# Patient Record
Sex: Female | Born: 1980 | Hispanic: Yes | Marital: Single | Smoking: Never smoker
Health system: Southern US, Community
[De-identification: ages and names within clinical notes are randomized; demographics above are authoritative.]

## PROBLEM LIST (undated history)

## (undated) DIAGNOSIS — K219 Gastro-esophageal reflux disease without esophagitis: Secondary | ICD-10-CM

## (undated) DIAGNOSIS — R51 Headache: Secondary | ICD-10-CM

## (undated) DIAGNOSIS — R519 Headache, unspecified: Secondary | ICD-10-CM

## (undated) HISTORY — PX: BREAST BIOPSY: SHX20

---

## 2010-02-14 ENCOUNTER — Inpatient Hospital Stay: Payer: Self-pay | Admitting: Psychiatry

## 2010-06-13 ENCOUNTER — Emergency Department: Payer: Self-pay | Admitting: Emergency Medicine

## 2010-07-03 ENCOUNTER — Emergency Department: Payer: Self-pay | Admitting: Emergency Medicine

## 2011-09-02 ENCOUNTER — Emergency Department: Payer: Self-pay | Admitting: Emergency Medicine

## 2016-05-08 ENCOUNTER — Ambulatory Visit: Payer: BLUE CROSS/BLUE SHIELD | Admitting: Anesthesiology

## 2016-05-08 ENCOUNTER — Encounter
Admission: RE | Disposition: A | Discharge status: Home / Self Care | Payer: Self-pay | Source: Ambulatory Visit | Attending: Otolaryngology

## 2016-05-08 ENCOUNTER — Ambulatory Visit
Admission: RE | Admit: 2016-05-08 | Discharge: 2016-05-08 | Disposition: A | Discharge status: Home / Self Care | Payer: BLUE CROSS/BLUE SHIELD | Source: Ambulatory Visit | Attending: Otolaryngology | Admitting: Otolaryngology

## 2016-05-08 DIAGNOSIS — K219 Gastro-esophageal reflux disease without esophagitis: Secondary | ICD-10-CM | POA: Diagnosis not present

## 2016-05-08 DIAGNOSIS — Z9889 Other specified postprocedural states: Secondary | ICD-10-CM | POA: Diagnosis not present

## 2016-05-08 DIAGNOSIS — J020 Streptococcal pharyngitis: Secondary | ICD-10-CM | POA: Insufficient documentation

## 2016-05-08 DIAGNOSIS — R51 Headache: Secondary | ICD-10-CM | POA: Insufficient documentation

## 2016-05-08 HISTORY — DX: Headache: R51

## 2016-05-08 HISTORY — DX: Gastro-esophageal reflux disease without esophagitis: K21.9

## 2016-05-08 HISTORY — DX: Headache, unspecified: R51.9

## 2016-05-08 HISTORY — PX: TONSILLECTOMY: SHX5217

## 2016-05-08 SURGERY — TONSILLECTOMY
Anesthesia: General | Site: Mouth | Wound class: Clean Contaminated

## 2016-05-08 MED ORDER — GLYCOPYRROLATE 0.2 MG/ML IJ SOLN
INTRAMUSCULAR | Status: DC | PRN
  Administered 2016-05-08: 0.2 mg via INTRAVENOUS

## 2016-05-08 MED ORDER — OXYCODONE HCL 5 MG PO TABS: 5.0000 mg | ORAL_TABLET | Freq: Once | ORAL | Status: AC | PRN

## 2016-05-08 MED ORDER — SUCCINYLCHOLINE CHLORIDE 20 MG/ML IJ SOLN
INTRAMUSCULAR | Status: DC | PRN
  Administered 2016-05-08: 100 mg via INTRAVENOUS

## 2016-05-08 MED ORDER — MIDAZOLAM HCL 5 MG/5ML IJ SOLN
INTRAMUSCULAR | Status: DC | PRN
Start: 2016-05-08 — End: 2016-05-08
  Administered 2016-05-08: 2 mg via INTRAVENOUS

## 2016-05-08 MED ORDER — LACTATED RINGERS IV SOLN
INTRAVENOUS | Status: DC
  Administered 2016-05-08: 08:00:00 via INTRAVENOUS

## 2016-05-08 MED ORDER — ACETAMINOPHEN 10 MG/ML IV SOLN
1000.0000 mg | Freq: Once | INTRAVENOUS | Status: AC
  Administered 2016-05-08: 1000 mg via INTRAVENOUS

## 2016-05-08 MED ORDER — PROMETHAZINE HCL 12.5 MG PO TABS: 12.5000 mg | ORAL_TABLET | Freq: Four times a day (QID) | ORAL | 0 refills | Status: DC | PRN

## 2016-05-08 MED ORDER — MEPERIDINE HCL 25 MG/ML IJ SOLN: 6.2500 mg | INTRAMUSCULAR | Status: DC | PRN

## 2016-05-08 MED ORDER — HYDROMORPHONE HCL 1 MG/ML IJ SOLN
0.2500 mg | INTRAMUSCULAR | Status: DC | PRN
  Administered 2016-05-08 (×2): 0.2 mg via INTRAVENOUS
  Administered 2016-05-08: 0.3 mg via INTRAVENOUS
  Administered 2016-05-08: 0.2 mg via INTRAVENOUS

## 2016-05-08 MED ORDER — OXYCODONE HCL 5 MG/5ML PO SOLN
5.0000 mg | Freq: Once | ORAL | Status: AC | PRN
  Administered 2016-05-08: 5 mg via ORAL

## 2016-05-08 MED ORDER — LIDOCAINE VISCOUS 2 % MT SOLN: 10.0000 mL | Freq: Four times a day (QID) | OROMUCOSAL | 0 refills | Status: DC | PRN

## 2016-05-08 MED ORDER — PROMETHAZINE HCL 25 MG/ML IJ SOLN: 6.2500 mg | INTRAMUSCULAR | Status: DC | PRN

## 2016-05-08 MED ORDER — LIDOCAINE HCL (CARDIAC) 20 MG/ML IV SOLN
INTRAVENOUS | Status: DC | PRN
  Administered 2016-05-08: 50 mg via INTRAVENOUS

## 2016-05-08 MED ORDER — DEXAMETHASONE SODIUM PHOSPHATE 4 MG/ML IJ SOLN
INTRAMUSCULAR | Status: DC | PRN
  Administered 2016-05-08: 8 mg via INTRAVENOUS

## 2016-05-08 MED ORDER — OXYMETAZOLINE HCL 0.05 % NA SOLN
NASAL | Status: DC | PRN
  Administered 2016-05-08: 1 via TOPICAL

## 2016-05-08 MED ORDER — PROPOFOL 10 MG/ML IV BOLUS
INTRAVENOUS | Status: DC | PRN
Start: 2016-05-08 — End: 2016-05-08
  Administered 2016-05-08: 200 mg via INTRAVENOUS

## 2016-05-08 MED ORDER — FENTANYL CITRATE (PF) 100 MCG/2ML IJ SOLN
INTRAMUSCULAR | Status: DC | PRN
  Administered 2016-05-08: 50 ug via INTRAVENOUS
  Administered 2016-05-08: 100 ug via INTRAVENOUS

## 2016-05-08 MED ORDER — SCOPOLAMINE 1 MG/3DAYS TD PT72
1.0000 | MEDICATED_PATCH | TRANSDERMAL | Status: DC
  Administered 2016-05-08: 1.5 mg via TRANSDERMAL

## 2016-05-08 MED ORDER — ONDANSETRON HCL 4 MG/2ML IJ SOLN
INTRAMUSCULAR | Status: DC | PRN
  Administered 2016-05-08: 4 mg via INTRAVENOUS

## 2016-05-08 MED ORDER — BUPIVACAINE HCL (PF) 0.25 % IJ SOLN
INTRAMUSCULAR | Status: DC | PRN
  Administered 2016-05-08: 2 mL

## 2016-05-08 MED ORDER — HYDROCODONE-ACETAMINOPHEN 7.5-325 MG/15ML PO SOLN: 10.0000 mL | Freq: Four times a day (QID) | ORAL | 0 refills | Status: DC | PRN

## 2016-05-08 SURGICAL SUPPLY — 17 items
BLADE BOVIE TIP EXT 4 (BLADE) ×3 IMPLANT
CANISTER SUCT 1200ML W/VALVE (MISCELLANEOUS) ×3 IMPLANT
CATH ROBINSON RED A/P 10FR (CATHETERS) ×3 IMPLANT
COAG SUCT 10F 3.5MM HAND CTRL (MISCELLANEOUS) ×3 IMPLANT
GLOVE BIO SURGEON STRL SZ7.5 (GLOVE) ×6 IMPLANT
HANDLE SUCTION POOLE (INSTRUMENTS) ×1 IMPLANT
KIT ROOM TURNOVER OR (KITS) ×3 IMPLANT
NEEDLE HYPO 25GX1X1/2 BEV (NEEDLE) ×3 IMPLANT
NS IRRIG 500ML POUR BTL (IV SOLUTION) ×3 IMPLANT
PACK TONSIL/ADENOIDS (PACKS) ×3 IMPLANT
PAD GROUND ADULT SPLIT (MISCELLANEOUS) ×3 IMPLANT
PENCIL ELECTRO HAND CTR (MISCELLANEOUS) ×3 IMPLANT
SOL ANTI-FOG 6CC FOG-OUT (MISCELLANEOUS) ×1 IMPLANT
SOL FOG-OUT ANTI-FOG 6CC (MISCELLANEOUS) ×2
STRAP BODY AND KNEE 60X3 (MISCELLANEOUS) ×3 IMPLANT
SUCTION POOLE HANDLE (INSTRUMENTS) ×3
SYR 5ML LL (SYRINGE) ×3 IMPLANT

## 2016-05-08 NOTE — H&P (Signed)
..  History and Physical paper copy reviewed and updated date of procedure and will be scanned into system.  

## 2016-05-08 NOTE — Discharge Instructions (Signed)
Amigdalectoma, adultos, cuidados posteriores (Tonsillectomy, Adult, Care After) Siga estas instrucciones durante las prximas semanas. Estas indicaciones le proporcionan informacin acerca de cmo deber cuidarse despus del procedimiento. El mdico tambin podr darle instrucciones ms especficas. El tratamiento se ha planificado de acuerdo a las prcticas mdicas actuales, pero a veces se producen problemas. Comunquese con el mdico si tiene algn problema o tiene dudas despus del procedimiento. QU ESPERAR DESPUS DEL PROCEDIMIENTO Despus del procedimiento, es tpico tener los siguientes sntomas:  Sentir la [place] dormida y se reducir su sentido del gusto.  Puede sentir dolor y dificultades para tragar.  Puede sentir dolor en la [employer] o sentir un chasquido al Barista.  Al beber lquidos, estos podran gotear por la [employer].  Puede que su voz suene apagada.  La zona central superior de la boca, (campanilla) podra estar muy hinchada.  Es posible que tenga tos constante y Glass blower/designer la mucosidad y la flema de su garganta. INSTRUCCIONES PARA EL CUIDADO EN EL HOGAR   Descanse todo lo que pueda, manteniendo siempre la cabeza elevada.  Beba abundantes lquidos. Esto Engineer, materials y favorece el proceso de curacin.  Tome los medicamentos solamente como se lo haya indicado el mdico.  Los alimentos blandos y fros tales como la gelatina, los sorbetes, los [place], los helados de agua y las bebidas fras generalmente son los que mejor se Research scientist (physical sciences). Algunos das despus de la ciruga, podr comer ms alimentos slidos.  Evite los enjuagues bucales y las grgaras.  Evite el contacto con personas que padezcan infecciones respiratorias superiores como resfros o anginas. SOLICITE ATENCIN MDICA SI:   Teacher, music y no puede controlarlo con los medicamentos.  Tiene fiebre.  Tiene una erupcin cutnea.  Se siente mareado o se desmaya.  No puede  tragar, incluso pequeas cantidades de lquidos o saliva.  Su orina se vuelve [employer]. SOLICITE ATENCIN MDICA DE INMEDIATO SI:   Tiene dificultad para respirar.  Tiene efectos secundarios o una reaccin alrgica a los medicamentos.  Tiene un sangrado de color rojo brillante por la garganta o vomita sangre.   Esta informacin no tiene Theme park manager el consejo del mdico. Asegrese de hacerle al mdico cualquier pregunta que tenga.   Document Released: 02/02/2007 Document Revised: 12/06/2014 Elsevier Interactive Patient Education 2016 [employer].    [place] general, adultos, cuidados posteriores (General Anesthesia, Adult, Care After) Siga estas instrucciones durante las prximas semanas. Estas indicaciones le proporcionan informacin acerca de cmo deber cuidarse despus del procedimiento. El mdico tambin podr darle instrucciones ms especficas. El tratamiento se ha planificado de acuerdo a las prcticas mdicas actuales, pero a veces se producen problemas. Comunquese con el mdico si tiene algn problema o tiene dudas despus del procedimiento. QU ESPERAR DESPUS DEL PROCEDIMIENTO Despus del procedimiento es habitual experimentar:  Somnolencia.  Nuseas y vmitos. INSTRUCCIONES PARA EL CUIDADO EN EL HOGAR  Durante las primeras 24 horas luego de la anestesia general:  Haga que una persona responsable se quede con usted.  No conduzca un automvil. Si est solo, no viaje en transporte pblico.  No beba alcohol.  No tome medicamentos que no le haya recetado su mdico.  No firme documentos importantes ni tome decisiones trascendentes.  Puede reanudar su dieta y sus actividades normales segn le haya indicado el mdico.  Cambie los vendajes (apsitos) tal como se le indic.  Si tiene preguntas o se le presenta algn problema relacionado con la anestesia general, comunquese con el hospital y pida por el  anestesista o anestesilogo de [place]. SOLICITE  ATENCIN MDICA SI:  Tiene nuseas y vmitos durante el da posterior a la anestesia.  Le aparece una erupcin cutnea. SOLICITE ATENCIN MDICA DE INMEDIATO SI:   Tiene dificultad para respirar.  Siente dolor en el pecho.  Tiene algn problema alrgico.   Esta informacin no tiene como fin reemplazar el consejo del mdico. Asegrese de hacerle al mdico cualquier pregunta que tenga.   Document Released: 07/22/2005 Document Revised: 08/12/2014 Elsevier Interactive Patient Education [employer]2016 Elsevier Inc.

## 2016-05-08 NOTE — Transfer of Care (Signed)
Immediate Anesthesia Transfer of Care Note  Patient: Michelle Mcbride  Procedure(s) Performed: Procedure(s) with comments: TONSILLECTOMY (N/A) - NEEDS INTERPRETER  Patient Location: PACU  Anesthesia Type: General  Level of Consciousness: awake, alert  and patient cooperative  Airway and Oxygen Therapy: Patient Spontanous Breathing and Patient connected to supplemental oxygen  Post-op Assessment: Post-op Vital signs reviewed, Patient's Cardiovascular Status Stable, Respiratory Function Stable, Patent Airway and No signs of Nausea or vomiting  Post-op Vital Signs: Reviewed and stable  Complications: No apparent anesthesia complications

## 2016-05-08 NOTE — Anesthesia Postprocedure Evaluation (Signed)
Anesthesia Post Note  Patient: Michelle Mcbride  ProValinda Partycedure(s) Performed: Procedure(s) (LRB): TONSILLECTOMY (N/A)  Patient location during evaluation: PACU Anesthesia Type: General Level of consciousness: awake and alert and oriented Pain management: pain level controlled Vital Signs Assessment: post-procedure vital signs reviewed and stable Respiratory status: spontaneous breathing and nonlabored ventilation Cardiovascular status: stable Postop Assessment: no signs of nausea or vomiting and adequate PO intake Anesthetic complications: no    Michelle Mcbride

## 2016-05-08 NOTE — Anesthesia Procedure Notes (Signed)
Procedure Name: Intubation Date/Time: 05/08/2016 8:27 AM Performed by: Andee PolesBUSH, Ninamarie Keel Pre-anesthesia Checklist: Patient identified, Emergency Drugs available, Suction available, Patient being monitored and Timeout performed Patient Re-evaluated:Patient Re-evaluated prior to inductionOxygen Delivery Method: Circle system utilized Preoxygenation: Pre-oxygenation with 100% oxygen Intubation Type: IV induction Ventilation: Mask ventilation without difficulty Laryngoscope Size: Mac and 3 Grade View: Grade II Tube type: Oral Rae Tube size: 7.0 mm Number of attempts: 2 Airway Equipment and Method: Bougie stylet Placement Confirmation: ETT inserted through vocal cords under direct vision,  positive ETCO2 and breath sounds checked- equal and bilateral Tube secured with: Tape Dental Injury: Teeth and Oropharynx as per pre-operative assessment

## 2016-05-08 NOTE — Anesthesia Preprocedure Evaluation (Signed)
Anesthesia Evaluation  Patient identified by MRN, date of birth, ID band Patient awake    Reviewed: Allergy & Precautions, NPO status , Patient's Chart, lab work & pertinent test results  Airway Mallampati: I  TM Distance: >3 FB Neck ROM: Full    Dental no notable dental hx.    Pulmonary neg pulmonary ROS,    Pulmonary exam normal        Cardiovascular negative cardio ROS Normal cardiovascular exam     Neuro/Psych  Headaches,    GI/Hepatic Neg liver ROS, GERD  Controlled,  Endo/Other  negative endocrine ROS  Renal/GU negative Renal ROS     Musculoskeletal negative musculoskeletal ROS (+)   Abdominal   Peds  Hematology negative hematology ROS (+)   Anesthesia Other Findings   Reproductive/Obstetrics negative OB ROS                             Anesthesia Physical Anesthesia Plan  ASA: II  Anesthesia Plan: General   Post-op Pain Management:    Induction: Intravenous  Airway Management Planned: Oral ETT  Additional Equipment:   Intra-op Plan:   Post-operative Plan:   Informed Consent: I have reviewed the patients History and Physical, chart, labs and discussed the procedure including the risks, benefits and alternatives for the proposed anesthesia with the patient or authorized representative who has indicated his/her understanding and acceptance.     Plan Discussed with: CRNA  Anesthesia Plan Comments:         Anesthesia Quick Evaluation

## 2016-05-08 NOTE — Op Note (Signed)
..  05/08/2016  8:48 AM    Valinda PartyMiranda, Michelle  [id number]   Pre-Op Dx:  RECURRENT STREP  Post-op Dx: RECURRENT STREP  Proc:Tonsillectomy > age 35  Surg: Obie Silos  Anes:  General Endotracheal  EBL:  <5  Comp:  None  Findings:  3+ friable and erythematous cryptic tonsils bilaterally L>R, fibrotic scar  Procedure: After the patient was identified in holding and the history and physical and consent was reviewed, the patient was taken to the operating room and placed in a supine position.  General endotracheal anesthesia was induced in the normal fashion.  At this time, the patient was rotated 45 degrees and a shoulder roll was placed.  At this time, a McIvor mouthgag was inserted into the patient's oral cavity and suspended from the Mayo stand without injury to teeth, lips, or gums.  Next a red rubber catheter was inserted into the patient left nostril for retraction of the uvula and soft palate superiorly.  Next a curved Alice clamp was attached to the patient's right superior tonsillar pole and retracted medially and inferiorly.  A Bovie electrocautery was used to dissect the patient's right tonsil in a subcapsular plane.  Meticulous hemostasis was achieved with Bovie suction cautery.  At this time, the mouth gag was released from suspension for 1 minute.  Attention now was directed to the patient's left side.  In a similar fashion the curved Alice clamp was attached to the superior pole and this was retracted medially and inferiorly and the tonsil was excised in a subcapsular plane with Bovie electrocautery.  After completion of the second tonsil, meticulous hemostasis was continued.  At this time, attention was directed to the patient's Adenoidectomy.  Under indirect visualization using an operating mirror, the adenoid tissue was visualized noted to be absent in nature.  At this time, the patient's nasal cavity and oral cavity was irrigated with sterile saline.  Two cc's of 0.5%  Marcaine was injected into the anterior and posterior tonsillar fossa bilaterally.  Following this  The care of patient was returned to anesthesia, awakened, and transferred to recovery in stable condition.  Dispo:  PACU to home  Plan: Soft diet.  Limit exercise and strenuous activity for 2 weeks.  Fluid hydration  Recheck my office three weeks.   Vivi Piccirilli 8:48 AM 05/08/2016

## 2016-05-09 ENCOUNTER — Encounter: Payer: Self-pay | Admitting: Otolaryngology

## 2016-05-10 LAB — SURGICAL PATHOLOGY

## 2016-05-24 ENCOUNTER — Emergency Department: Payer: BLUE CROSS/BLUE SHIELD

## 2016-05-24 ENCOUNTER — Emergency Department
Admission: EM | Admit: 2016-05-24 | Discharge: 2016-05-24 | Disposition: A | Discharge status: Home / Self Care | Payer: BLUE CROSS/BLUE SHIELD | Attending: Emergency Medicine | Admitting: Emergency Medicine

## 2016-05-24 ENCOUNTER — Encounter: Payer: Self-pay | Admitting: Emergency Medicine

## 2016-05-24 DIAGNOSIS — M545 Low back pain, unspecified: Secondary | ICD-10-CM

## 2016-05-24 DIAGNOSIS — S22060A Wedge compression fracture of T7-T8 vertebra, initial encounter for closed fracture: Secondary | ICD-10-CM | POA: Diagnosis not present

## 2016-05-24 DIAGNOSIS — Y9389 Activity, other specified: Secondary | ICD-10-CM | POA: Diagnosis not present

## 2016-05-24 DIAGNOSIS — Y929 Unspecified place or not applicable: Secondary | ICD-10-CM | POA: Diagnosis not present

## 2016-05-24 DIAGNOSIS — W109XXA Fall (on) (from) unspecified stairs and steps, initial encounter: Secondary | ICD-10-CM | POA: Insufficient documentation

## 2016-05-24 DIAGNOSIS — Y999 Unspecified external cause status: Secondary | ICD-10-CM | POA: Diagnosis not present

## 2016-05-24 DIAGNOSIS — K5641 Fecal impaction: Secondary | ICD-10-CM | POA: Insufficient documentation

## 2016-05-24 DIAGNOSIS — W19XXXA Unspecified fall, initial encounter: Secondary | ICD-10-CM

## 2016-05-24 DIAGNOSIS — S22050A Wedge compression fracture of T5-T6 vertebra, initial encounter for closed fracture: Secondary | ICD-10-CM | POA: Diagnosis not present

## 2016-05-24 DIAGNOSIS — S299XXA Unspecified injury of thorax, initial encounter: Secondary | ICD-10-CM | POA: Diagnosis present

## 2016-05-24 DIAGNOSIS — Z79899 Other long term (current) drug therapy: Secondary | ICD-10-CM | POA: Diagnosis not present

## 2016-05-24 DIAGNOSIS — S22000A Wedge compression fracture of unspecified thoracic vertebra, initial encounter for closed fracture: Secondary | ICD-10-CM

## 2016-05-24 LAB — HEMOGLOBIN AND HEMATOCRIT, BLOOD
HCT: 41.8 % (ref 35.0–47.0)
HEMOGLOBIN: 14.6 g/dL (ref 12.0–16.0)

## 2016-05-24 LAB — HCG, QUANTITATIVE, PREGNANCY: HCG, BETA CHAIN, QUANT, S: 1 m[IU]/mL (ref <5)

## 2016-05-24 MED ORDER — MORPHINE SULFATE (PF) 2 MG/ML IV SOLN
4.0000 mg | Freq: Once | INTRAVENOUS | Status: AC
  Administered 2016-05-24: 4 mg via INTRAVENOUS
  Filled 2016-05-24: qty 2

## 2016-05-24 MED ORDER — MORPHINE SULFATE (PF) 2 MG/ML IV SOLN
2.0000 mg | Freq: Once | INTRAVENOUS | Status: AC
Start: 2016-05-24 — End: 2016-05-24
  Administered 2016-05-24: 2 mg via INTRAVENOUS
  Filled 2016-05-24: qty 1

## 2016-05-24 MED ORDER — ONDANSETRON HCL 4 MG/2ML IJ SOLN
4.0000 mg | Freq: Once | INTRAMUSCULAR | Status: AC
  Administered 2016-05-24: 4 mg via INTRAVENOUS
  Filled 2016-05-24: qty 2

## 2016-05-24 MED ORDER — OXYCODONE HCL 5 MG PO TABS: 5.0000 mg | ORAL_TABLET | ORAL | 0 refills | Status: AC | PRN

## 2016-05-24 MED ORDER — DIAZEPAM 5 MG PO TABS
5.0000 mg | ORAL_TABLET | Freq: Once | ORAL | Status: AC
  Administered 2016-05-24: 5 mg via ORAL
  Filled 2016-05-24: qty 1

## 2016-05-24 MED ORDER — ACETAMINOPHEN 500 MG PO TABS
1000.0000 mg | ORAL_TABLET | Freq: Once | ORAL | Status: AC
  Administered 2016-05-24: 1000 mg via ORAL
  Filled 2016-05-24: qty 2

## 2016-05-24 MED ORDER — CYCLOBENZAPRINE HCL 10 MG PO TABS
10.0000 mg | ORAL_TABLET | Freq: Three times a day (TID) | ORAL | 0 refills | Status: AC | PRN
Start: 2016-05-24 — End: ?

## 2016-05-24 MED ORDER — HYDROMORPHONE HCL 1 MG/ML IJ SOLN
1.0000 mg | INTRAMUSCULAR | Status: DC | PRN
  Administered 2016-05-24: 1 mg via INTRAVENOUS
  Filled 2016-05-24: qty 1

## 2016-05-24 MED ORDER — NAPROXEN 500 MG PO TABS: 500.0000 mg | ORAL_TABLET | Freq: Two times a day (BID) | ORAL | Status: DC

## 2016-05-24 MED ORDER — HYDROCODONE-ACETAMINOPHEN 5-325 MG PO TABS: 1.0000 | ORAL_TABLET | Freq: Four times a day (QID) | ORAL | 0 refills | Status: AC | PRN

## 2016-05-24 MED ORDER — KETAMINE HCL 10 MG/ML IJ SOLN: 0.2000 mg/kg | Freq: Once | INTRAMUSCULAR | Status: DC

## 2016-05-24 MED ORDER — OXYCODONE HCL 5 MG PO TABS
5.0000 mg | ORAL_TABLET | ORAL | Status: DC | PRN
  Administered 2016-05-24: 5 mg via ORAL
  Filled 2016-05-24: qty 1

## 2016-05-24 MED ORDER — NAPROXEN 500 MG PO TABS: 500.0000 mg | ORAL_TABLET | Freq: Two times a day (BID) | ORAL | 0 refills | Status: AC

## 2016-05-24 NOTE — Discharge Instructions (Addendum)
° °  Take hydrocodone as prescribed for severe pain. Do not drink alcohol, drive or participate in any other potentially dangerous activities while taking this medication as it may make you sleepy. Do not take this medication with any other sedating medications, either prescription or over-the-counter. If you were prescribed Percocet or Vicodin, do not take these with acetaminophen (Tylenol) as it is already contained within these medications.   This medication is an opiate (or narcotic) pain medication and can be habit forming.  Use it as little as possible to achieve adequate pain control.  Do not use or use it with extreme caution if you have a history of opiate abuse or dependence.  If you are on a pain contract with your primary care doctor or a pain specialist, be sure to let them know you were prescribed this medication today from the [facility].  This medication is intended for your use only - do not give any to anyone else and keep it in a secure place where nobody else, especially children, have access to it.  It will also cause or worsen constipation, so you may want to consider taking an over-the-counter stool softener while you are taking this medication.  Please follow up with your doctor as soon as possible regarding today's ED visit and your back pain.  Return to the ED for worsening back pain, fever, weakness or numbness of either leg, or if you develop either (1) an inability to urinate or have bowel movements, or (2) loss of your ability to control your bathroom functions (if you start having "accidents"), or if you develop other new symptoms that concern you.

## 2016-05-24 NOTE — ED Notes (Addendum)
Pt received pain medication with EMS, unable to recall what they gave her.

## 2016-05-24 NOTE — ED Triage Notes (Signed)
Pt fell down 16 stairs. Denies LOC. C/o severe back pain. Pt appears in severe pain. c collar applied in triage

## 2016-05-24 NOTE — ED Provider Notes (Signed)
Patient was received in signout from Dr. Fanny BienQuale.  Patient fell down flight of stairs complaining of a diffuse midline back pain. CT scan of the neck shows no evidence of cervical spine fracture. Patient had no loss of consciousness. Lumbar and thoracic spine films are concerning for compression fracture. Given mechanism of injury and persistent pain will order CT imaging to further evaluate.  ----------------------------------------- 8:31 PM on 05/24/2016 -----------------------------------------  CT imaging does show evidence of stable mild superior endplate compression fractures of the thoracic spine. On repeat clinical exam the patient does have evidence of probable fracture of the coccyx. Pain was controlled with IV analgesia and then transition to oral pain medication. Patient was able to ambulate with a steady gait. She has a normal neuro exam. Patient stable for outpatient follow-up.   Willy EddyPatrick Riaan Toledo, MD 05/24/16 2042

## 2016-05-24 NOTE — ED Notes (Signed)
MD at bedside with interpreter

## 2016-05-24 NOTE — ED Notes (Signed)
With interpreter at bedside to assess patient.

## 2016-05-24 NOTE — ED Provider Notes (Signed)
[facility] Emergency Department Provider Note   ____________________________________________   First MD Initiated Contact with Patient 05/24/16 1315     (approximate)  I have reviewed the triage vital signs and the nursing notes.   HISTORY  Chief Complaint Fall  Spanish interpreter utilize  HPI Michelle Mcbride is a 35 y.o. female presents for evaluation of falling down stairs  Patient reports that she was reaching to keep if child away from the stairs when she fell onto her buttock, and then rolled down approximately 16 stairs. No loss of consciousness. She did not strike her head.  Patient reports having pain in her neck and lower back. She reports any attempt to move her legs causes a lot of pain in her lower back. Denies injury to the arms or legs. Does not take any medications. Had a tonsillectomy at the beginning of this month, otherwise denies significant surgical history. Really not taking any blood thinners  Denies headache. Denies numbness or tingling. No alcohol or drug use  No chest pain or abdominal pain. No bleeding  Past Medical History:  Diagnosis Date  . GERD (gastroesophageal reflux disease)   . Headache     There are no active problems to display for this patient.   Past Surgical History:  Procedure Laterality Date  . BREAST BIOPSY Left    states it was just a cyst removed  . TONSILLECTOMY N/A 05/08/2016   Procedure: TONSILLECTOMY;  Surgeon: Bud Face, MD;  Location: [facility];  Service: ENT;  Laterality: N/A;  NEEDS INTERPRETER    Prior to Admission medications   Medication Sig Start Date End Date Taking? Authorizing Provider  oxyCODONE (ROXICODONE) 5 MG/5ML solution Take 5-10 mLs by mouth every 6 (six) hours as needed. 05/20/16  Yes Historical Provider, MD  promethazine (PHENERGAN) 12.5 MG tablet Take 1 tablet by mouth every 8 (eight) hours as needed. 05/08/16  Yes Historical Provider, MD  RA LAXATIVE 5 MG  EC tablet Take 1-3 tablets by mouth daily as needed. 05/20/16  Yes Historical Provider, MD  traZODone (DESYREL) 50 MG tablet Take 1 tablet by mouth at bedtime as needed. 05/20/16  Yes Historical Provider, MD    Allergies Review of patient's allergies indicates no known allergies.  History reviewed. No pertinent family history.  Social History Social History  Substance Use Topics  . Smoking status: Never Smoker  . Smokeless tobacco: Never Used  . Alcohol use No    Review of Systems Constitutional: No fever/chills Eyes: No visual changes. ENT: No sore throat. Cardiovascular: Denies chest pain. Respiratory: Denies shortness of breath. Gastrointestinal: No abdominal pain.  No nausea, no vomiting.  No diarrhea.  No constipation. Genitourinary: Negative for dysuria. Musculoskeletal: See history of present illness Skin: Negative for rash. Neurological: Negative for headaches, focal weakness or numbness.  10-point ROS otherwise negative.  ____________________________________________   PHYSICAL EXAM:  VITAL SIGNS: ED Triage Vitals [05/24/16 1242]  Enc Vitals Group     BP 110/79     Pulse Rate 77     Resp (!) 24     Temp 97.6 F (36.4 C)     Temp Source Oral     SpO2 100 %     Weight 140 lb (63.5 kg)     Height      Head Circumference      Peak Flow      Pain Score 9     Pain Loc      Pain Edu?  Excl. in GC?     Constitutional: Alert and oriented. Well appearing and in no acute distressBut does appear uncomfortable laying supine in bed. Eyes: Conjunctivae are normal. PERRL. EOMI. Head: Atraumatic. Nose: No congestion/rhinnorhea. Mouth/Throat: Mucous membranes are moist.  Oropharynx non-erythematous. Neck: No stridor.  Cervical collar in place.  Log-rolled and Patient reports tenderness throughout the midline spine, though no focality is obvious. No step-off or deformity Cardiovascular: Normal rate, regular rhythm. Grossly normal heart sounds.  Good peripheral  circulation. Respiratory: Normal respiratory effort.  No retractions. Lungs CTAB. Gastrointestinal: Soft and nontender. No distention. No abdominal bruits. No CVA tenderness. Musculoskeletal: No lower extremity tenderness nor edema.  Patient reports any attempt to move the lower legs causes pain in her lower back. No neurologic deficit, able to wiggle all toes, able to flex at the hips and knees but does refer pain to the lower back. Vascular pulses normal lower extremity Neurologic:  Normal speech and language. No gross focal neurologic deficits are appreciated. Skin:  Skin is warm, dry and intact. No rash noted. Psychiatric: Mood and affect are normal. Speech and behavior are normal.  ____________________________________________   LABS (all labs ordered are listed, but only abnormal results are displayed)  Labs Reviewed  HCG, QUANTITATIVE, PREGNANCY  HEMOGLOBIN AND HEMATOCRIT, BLOOD   ____________________________________________  EKG   ____________________________________________  RADIOLOGY  Ct Cervical Spine Wo Contrast  Result Date: 05/24/2016 CLINICAL DATA:  35 year old female with neck and cervical spine pain following fall. Initial encounter. EXAM: CT CERVICAL SPINE WITHOUT CONTRAST TECHNIQUE: Multidetector CT imaging of the cervical spine was performed without intravenous contrast. Multiplanar CT image reconstructions were also generated. COMPARISON:  None. FINDINGS: Alignment: Normal. Skull base and vertebrae: No acute fracture. No primary bone lesion or focal pathologic process. Soft tissues and spinal canal: No prevertebral fluid or swelling. No visible canal hematoma. Tiny foci of gas along the anterior aspects of C1 and C2 are likely venous from IV. Disc levels:  Unremarkable. Upper chest: Negative. Other: None IMPRESSION: No evidence of acute injury or significant abnormalities. Electronically Signed   By: Harmon PierJeffrey  Hu M.D.   On: 05/24/2016 15:40     ____________________________________________   PROCEDURES  Procedure(s) performed: None  Procedures  Critical Care performed: No  ____________________________________________   INITIAL IMPRESSION / ASSESSMENT AND PLAN / ED COURSE  Pertinent labs & imaging results that were available during my care of the patient were reviewed by me and considered in my medical decision making (see chart for details).  Traumatic injury. Patient lost balance fell forward landing on her coccyx, then describes rolling down approximately 16 steps. She reports she was able to protect her head and did not strike her head and denies any headache, denies any direct fall or trauma to the head. She is neurologically intact but did note significant pain especially in the lower back. We'll proceed with plain film imaging of the thoracic and lumbar spine, plus CT of the cervical spine. Pain control, continue to monitor closely.  Clinical Course   ----------------------------------------- 3:47 PM on 05/24/2016 -----------------------------------------  Patient reports modest improvement in pain. She is resting comfortably. She reported to the interpreter that she has been having depression recently, denies any thoughts of harming herself or others. [facility], and reports over the last few months her depression symptoms seem to be coming back mostly low energy and feeling of no value. Discussed with the patient, she adamantly denies any thoughts of harming herself or anyone  else, and she is agreeable to calling her psychiatrist on Monday at [facility]  Ongoing care assigned to Dr. Roxan Hockey. Plan follow up on x-rays and reevaluate the patient for patient control and injury thereafter. Probable discharge after reexam and no evidence of injury on radiographs.  ____________________________________________   FINAL CLINICAL IMPRESSION(S) / ED DIAGNOSES  Final diagnoses:  Fall, initial  encounter  Acute midline low back pain without sciatica      NEW MEDICATIONS STARTED DURING THIS VISIT:  New Prescriptions   No medications on file     Note:  This document was prepared using Dragon voice recognition software and may include unintentional dictation errors.     Sharyn Creamer, MD 05/24/16 [phone]

## 2016-05-24 NOTE — ED Notes (Signed)
Pain between shoulder blade and lower down towards coccyx.  Pt in c-ollar at time of assessment. Pt tearful. Denies hitting head.

## 2017-06-25 ENCOUNTER — Encounter: Payer: Self-pay | Admitting: Emergency Medicine

## 2017-06-25 ENCOUNTER — Emergency Department
Admission: EM | Admit: 2017-06-25 | Discharge: 2017-06-25 | Disposition: A | Discharge status: Home / Self Care | Payer: BLUE CROSS/BLUE SHIELD | Attending: Emergency Medicine | Admitting: Emergency Medicine

## 2017-06-25 DIAGNOSIS — R509 Fever, unspecified: Secondary | ICD-10-CM | POA: Diagnosis present

## 2017-06-25 DIAGNOSIS — H65192 Other acute nonsuppurative otitis media, left ear: Secondary | ICD-10-CM | POA: Insufficient documentation

## 2017-06-25 DIAGNOSIS — B349 Viral infection, unspecified: Secondary | ICD-10-CM | POA: Insufficient documentation

## 2017-06-25 DIAGNOSIS — H65112 Acute and subacute allergic otitis media (mucoid) (sanguinous) (serous), left ear: Secondary | ICD-10-CM

## 2017-06-25 MED ORDER — AMOXICILLIN 500 MG PO CAPS
1000.0000 mg | ORAL_CAPSULE | Freq: Once | ORAL | Status: AC
  Administered 2017-06-25: 1000 mg via ORAL
  Filled 2017-06-25: qty 2

## 2017-06-25 MED ORDER — PSEUDOEPH-BROMPHEN-DM 30-2-10 MG/5ML PO SYRP: 10.0000 mL | ORAL_SOLUTION | Freq: Four times a day (QID) | ORAL | 0 refills | Status: AC | PRN

## 2017-06-25 MED ORDER — AMOXICILLIN 875 MG PO TABS: 875.0000 mg | ORAL_TABLET | Freq: Two times a day (BID) | ORAL | 0 refills | Status: AC

## 2017-06-25 NOTE — ED Notes (Signed)
Patient with complaint of left ear pain that started on Sunday.

## 2017-06-25 NOTE — ED Provider Notes (Signed)
[facility] Emergency Department Provider Note  ____________________________________________  Time seen: Approximately 7:48 PM  I have reviewed the triage vital signs and the nursing notes.   HISTORY  Chief Complaint Otalgia    HPI Michelle Mcbride is a 36 y.o. female who presents emergency department complaining of fevers and chills, body aches, nasal congestion, sore throat, ear pain.  Patient reports that symptoms began with fevers and chills and body aches.  Patient then began to experience nasal congestion, sore throat, coughing.  Over the last 2 days, patient has developed sharp left ear pain.  This is her primary complaint at this time.  Patient has been taking Tylenol Motrin which improves the body aches and fevers and chills.  Patient reports that nasal congestion is minimal.  Sore throat improves with Tylenol and Motrin.  Patient denies any hearing loss in the left ear.  No history of repeated infections.  Other than Tylenol Motrin, no medications at home.  No other complaints at this time.  Patient denies any headache, visual changes, neck pain, chest pain, shortness of breath, abdominal pain, nausea vomiting, diarrhea or constipation.  Past Medical History:  Diagnosis Date  . GERD (gastroesophageal reflux disease)   . Headache     There are no active problems to display for this patient.   Past Surgical History:  Procedure Laterality Date  . BREAST BIOPSY Left    states it was just a cyst removed  . TONSILLECTOMY N/A 05/08/2016   Procedure: TONSILLECTOMY;  Surgeon: Bud Facereighton Vaught, MD;  [facility];  Service: ENT;  Laterality: N/A;  NEEDS INTERPRETER    Prior to Admission medications   Medication Sig Start Date End Date Taking? Authorizing Provider  amoxicillin (AMOXIL) 875 MG tablet Take 1 tablet (875 mg total) by mouth 2 (two) times daily. 06/25/17   Ninoshka Wainwright, Delorise RoyalsJonathan D, PA-C  brompheniramine-pseudoephedrine-DM 30-2-10 MG/5ML  syrup Take 10 mLs by mouth 4 (four) times daily as needed. 06/25/17   Khy Pitre, Delorise RoyalsJonathan D, PA-C  cyclobenzaprine (FLEXERIL) 10 MG tablet Take 1 tablet (10 mg total) by mouth 3 (three) times daily as needed for muscle spasms. 05/24/16   Willy Eddyobinson, Patrick, MD  HYDROcodone-acetaminophen (NORCO/VICODIN) 5-325 MG tablet Take 1 tablet by mouth every 6 (six) hours as needed for moderate pain. 05/24/16   Sharyn CreamerQuale, Mark, MD  oxyCODONE (ROXICODONE) 5 MG/5ML solution Take 5-10 mLs by mouth every 6 (six) hours as needed. 05/20/16   [provider]  promethazine (PHENERGAN) 12.5 MG tablet Take 1 tablet by mouth every 8 (eight) hours as needed. 05/08/16   [provider]  RA LAXATIVE 5 MG EC tablet Take 1-3 tablets by mouth daily as needed. 05/20/16   [provider]  traZODone (DESYREL) 50 MG tablet Take 1 tablet by mouth at bedtime as needed. 05/20/16   [provider]    Allergies Patient has no known allergies.  No family history on file.  Social History Social History   Tobacco Use  . Smoking status: Never Smoker  . Smokeless tobacco: Never Used  Substance Use Topics  . Alcohol use: No  . Drug use: No     Review of Systems  Constitutional: Positive fever/chills Eyes: No visual changes. No discharge ENT: Positive nasal congestion, sore throat, left ear pain Cardiovascular: no chest pain. Respiratory: Positive cough. No SOB. Gastrointestinal: No abdominal pain.  No nausea, no vomiting.  No diarrhea.  No constipation. Musculoskeletal: Negative for musculoskeletal pain. Skin: Negative for rash, abrasions, lacerations, ecchymosis. Neurological:  Negative for headaches, focal weakness or numbness. 10-point ROS otherwise negative.  ____________________________________________   PHYSICAL EXAM:  VITAL SIGNS: ED Triage Vitals  Enc Vitals Group     BP 06/25/17 1903 124/79     Pulse Rate 06/25/17 1903 72     Resp 06/25/17 1903 16     Temp 06/25/17 1903  97.9 F (36.6 C)     Temp Source 06/25/17 1903 Oral     SpO2 06/25/17 1903 99 %     Weight 06/25/17 1858 143 lb (64.9 kg)     Height 06/25/17 1858 5\' 3"  (1.6 m)     Head Circumference --      Peak Flow --      Pain Score 06/25/17 1857 8     Pain Loc --      Pain Edu? --      Excl. in GC? --      Constitutional: Alert and oriented. Well appearing and in no acute distress. Eyes: Conjunctivae are normal. PERRL. EOMI. Head: Atraumatic. ENT:      Ears: EACs unremarkable bilaterally.  TM on right is mildly bulging but no erythema and no air-fluid level.  TM on the left as bulging, dusky, air-fluid level.  It      Nose: Moderate congestion/rhinnorhea.      Mouth/Throat: Mucous membranes are moist.  Oropharynx is mildly erythematous but nonedematous.  Tonsils are unremarkable bilaterally.  Uvula is midline. Neck: No stridor.  Neck is supple full range of motion Hematological/Lymphatic/Immunilogical: No cervical lymphadenopathy. Cardiovascular: Normal rate, regular rhythm. Normal S1 and S2.  Good peripheral circulation. Respiratory: Normal respiratory effort without tachypnea or retractions. Lungs CTAB. Good air entry to the bases with no decreased or absent breath sounds. Musculoskeletal: Full range of motion to all extremities. No gross deformities appreciated. Neurologic:  Normal speech and language. No gross focal neurologic deficits are appreciated.  Skin:  Skin is warm, dry and intact. No rash noted. Psychiatric: Mood and affect are normal. Speech and behavior are normal. Patient exhibits appropriate insight and judgement.   ____________________________________________   LABS (all labs ordered are listed, but only abnormal results are displayed)  Labs Reviewed - No data to display ____________________________________________  EKG   ____________________________________________  RADIOLOGY   No results  found.  ____________________________________________    PROCEDURES  Procedure(s) performed:    Procedures    Medications  amoxicillin (AMOXIL) capsule 1,000 mg (not administered)     ____________________________________________   INITIAL IMPRESSION / ASSESSMENT AND PLAN / ED COURSE  Pertinent labs & imaging results that were available during my care of the patient were reviewed by me and considered in my medical decision making (see chart for details).  Review of the [place] CSRS was performed in accordance of the [facility] prior to dispensing any controlled drugs.     Patient's diagnosis is consistent with viral infection with left-sided otitis media.  Patient has had symptoms for the past 3-4 days.  They improved with Tylenol and Motrin with the exception of the left ear.  Signs of infection on exam.  Differential include viral infection, bronchitis, pneumonia, flu, otitis externa, otitis media.  Has had symptoms for several days, but no indication for labs or imaging.  Patient is given first dose of antibiotics in the emergency department.. Patient will be discharged home with prescriptions for amoxicillin and Bromfed cough syrup.  And to continue Tylenol or Motrin at home.  Patient is to follow up with primary care as needed or otherwise directed.  Patient is given ED precautions to return to the ED for any worsening or new symptoms.     ____________________________________________  FINAL CLINICAL IMPRESSION(S) / ED DIAGNOSES  Final diagnoses:  Viral illness  Acute mucoid otitis media of left ear      NEW MEDICATIONS STARTED DURING THIS VISIT:  ED Discharge Orders        Ordered    amoxicillin (AMOXIL) 875 MG tablet  2 times daily     06/25/17 2007    brompheniramine-pseudoephedrine-DM 30-2-10 MG/5ML syrup  4 times daily PRN     06/25/17 2007          This chart was dictated using voice recognition software/Dragon. Despite best efforts to proofread, errors can  occur which can change the meaning. Any change was purely unintentional.    Racheal PatchesCuthriell, Eadie Repetto D, PA-C 06/25/17 2010    Dionne BucySiadecki, Sebastian, MD 06/25/17 2031

## 2017-06-25 NOTE — ED Triage Notes (Signed)
Patient presents to the ED with left ear pain since Monday.

## 2017-06-25 NOTE — ED Notes (Signed)
ED Provider at bedside. 

## 2017-08-11 IMAGING — CT CT CERVICAL SPINE W/O CM
3 of 4 series · 13 of 33 positions shown, 16 images · non-contrast
Comparison: None.

CLINICAL DATA: 35-year-old female with neck and cervical spine pain
following fall. Initial encounter.

EXAM:
CT CERVICAL SPINE WITHOUT CONTRAST
TECHNIQUE: Multidetector CT imaging of the cervical spine was performed without
intravenous contrast. Multiplanar CT image reconstructions were also
generated.

[Series 6: sagittal bone · sagittal · 0.24mm/px · 5 of 45 slices shown, 6 images]
[im 15/45  bone]
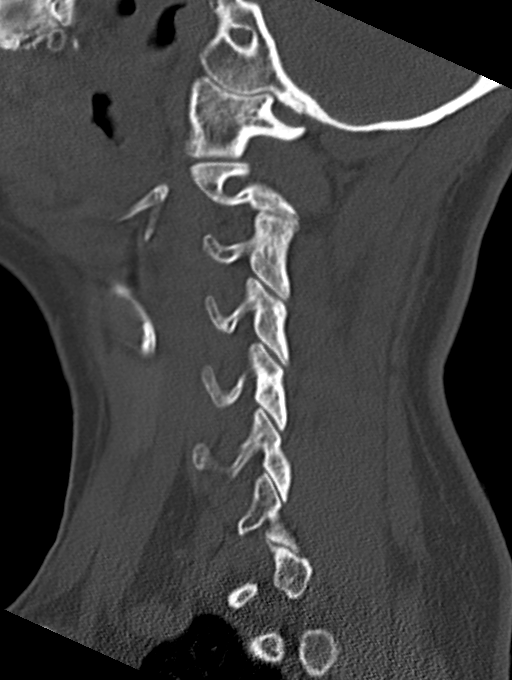
[im 19/45  bone]
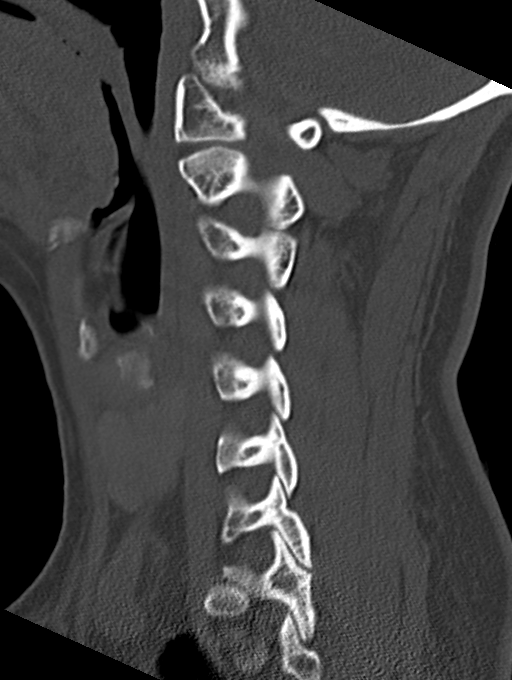
[im 23/45  soft-tissue]
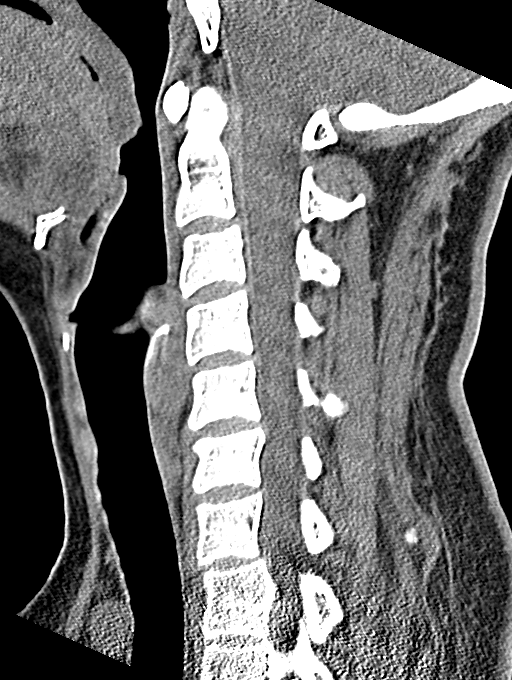
[im 23/45  bone]
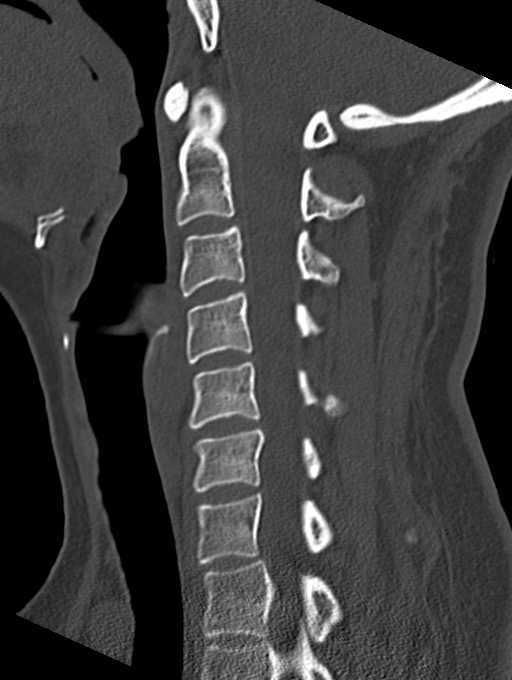
[im 26/45  bone]
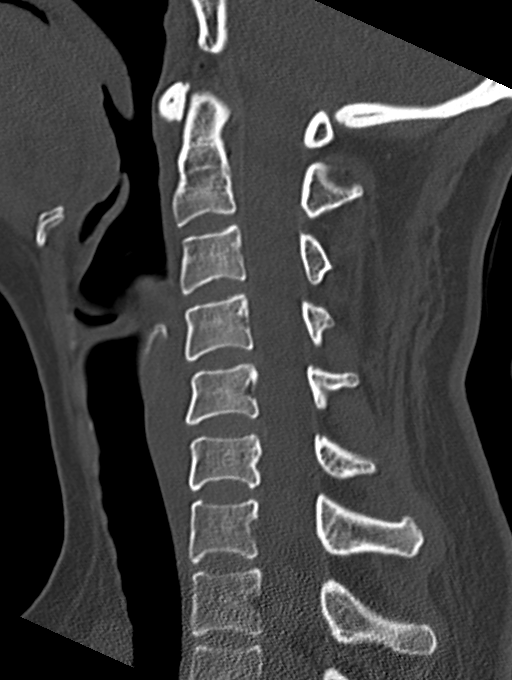
[im 30/45  bone]
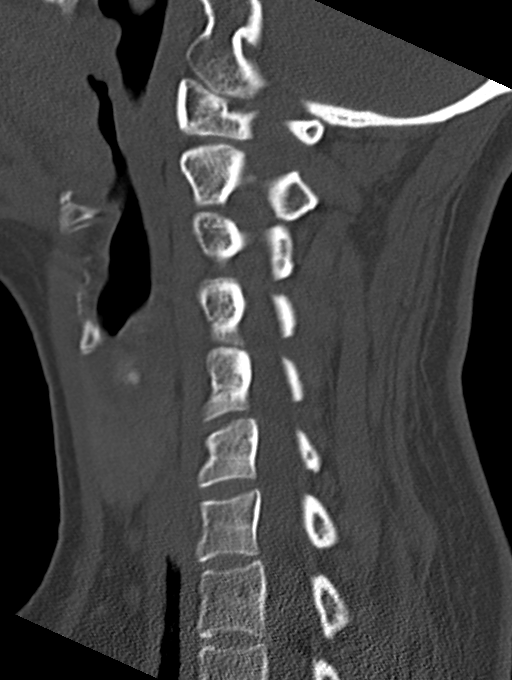

[Series 7: coronal bone · coronal · 0.24mm/px · 3 of 49 slices shown]
[im 10/49  bone]
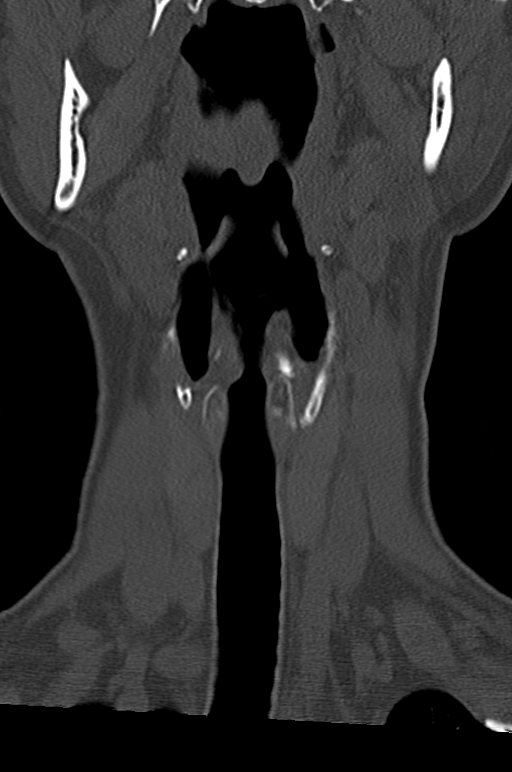
[im 20/49  bone]
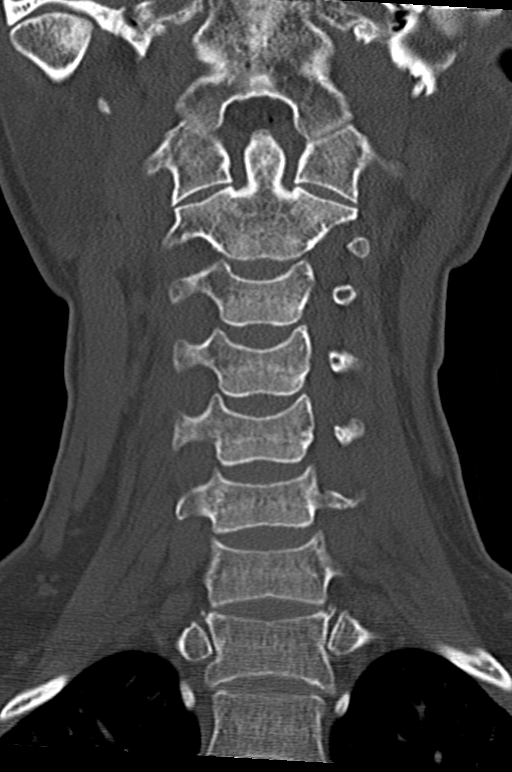
[im 29/49  bone]
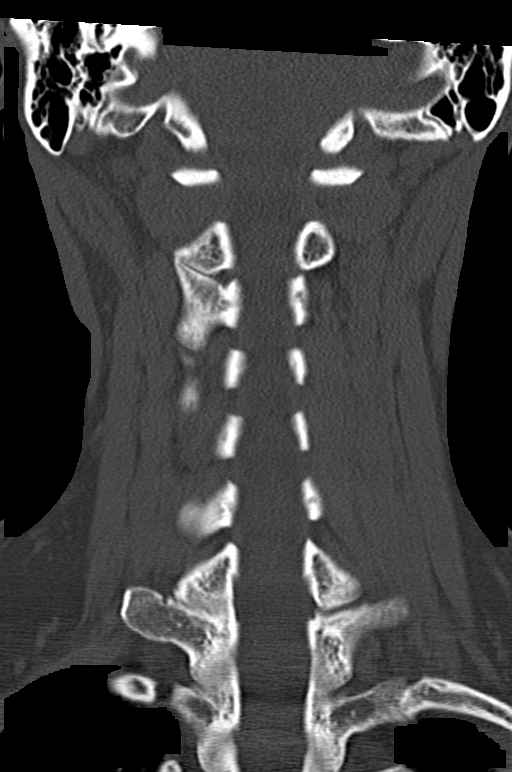

[Series 8: orthogonal bone · axial · 0.22mm/px · z∈[-231,-104]mm · 5 of 97 slices shown, 7 images]
[im 14/97  soft-tissue]
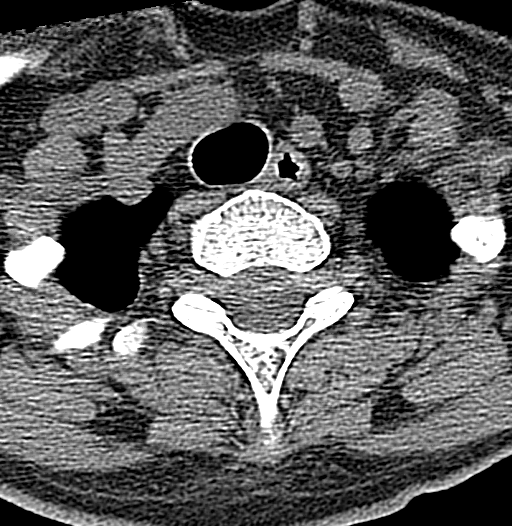
[im 14/97  bone]
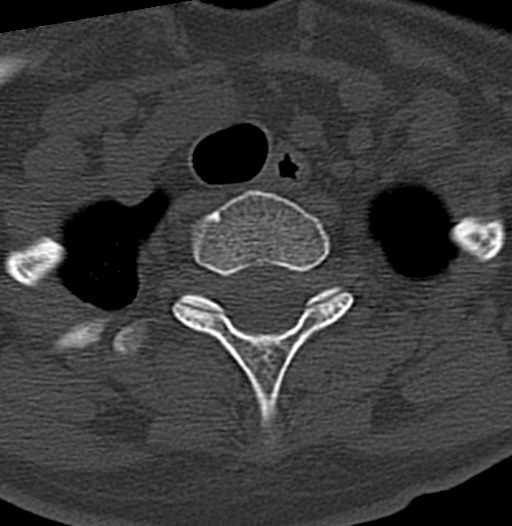
[im 28/97  bone]
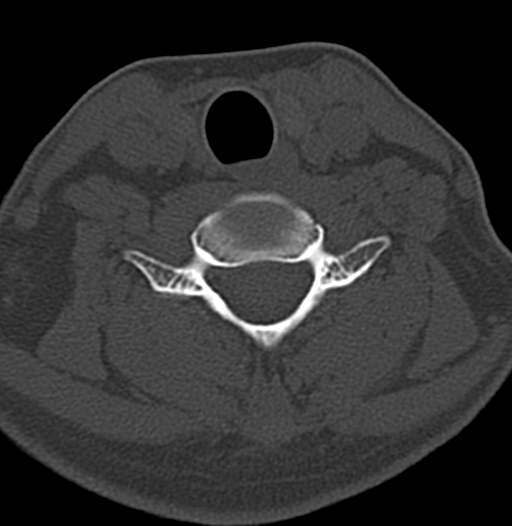
[im 55/97  bone]
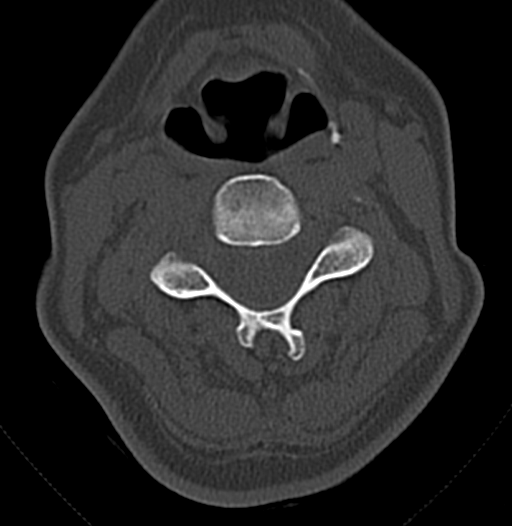
[im 69/97  bone]
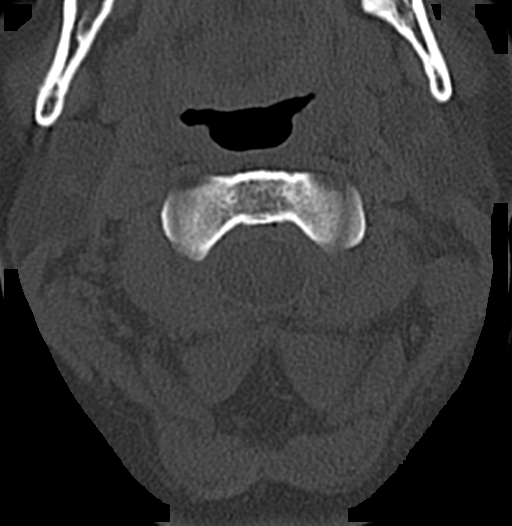
[im 83/97  soft-tissue]
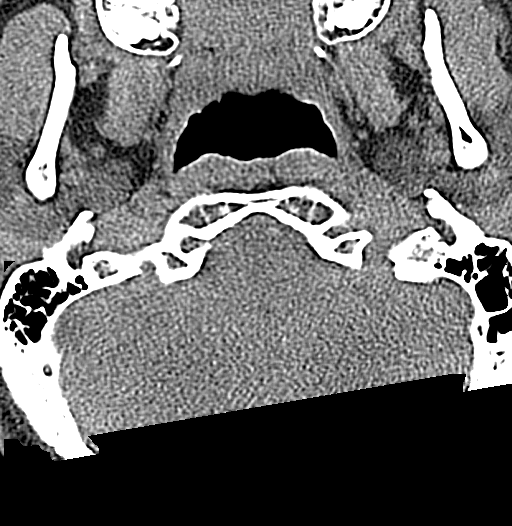
[im 83/97  bone]
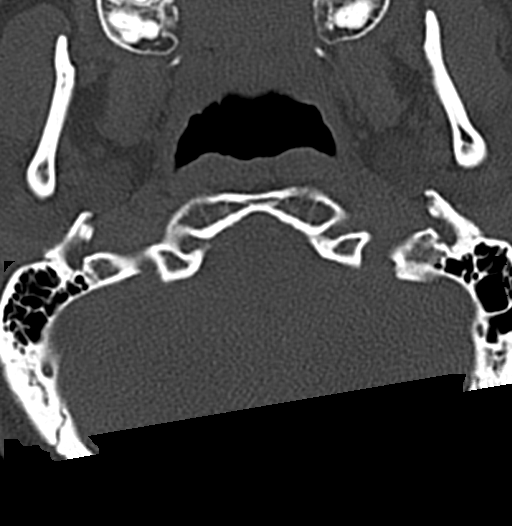

[13 of 33 positions shown; findings below may reference images not displayed]

FINDINGS: Alignment: Normal.

Skull base and vertebrae: No acute fracture. No primary bone lesion
or focal pathologic process.

Soft tissues and spinal canal: No prevertebral fluid or swelling. No
visible canal hematoma. Tiny foci of gas along the anterior aspects
of C1 and C2 are likely venous from IV.

Disc levels:  Unremarkable.

Upper chest: Negative.

Other: None
IMPRESSION: No evidence of acute injury or significant abnormalities.

## 2019-10-25 ENCOUNTER — Ambulatory Visit: Payer: BLUE CROSS/BLUE SHIELD | Attending: Internal Medicine

## 2019-10-25 ENCOUNTER — Other Ambulatory Visit: Payer: Self-pay

## 2019-10-25 DIAGNOSIS — Z23 Encounter for immunization: Secondary | ICD-10-CM

## 2019-10-25 NOTE — Progress Notes (Signed)
   Covid-19 Vaccination Clinic  Name:  Hailey Stormer    MRN: [id number] DOB: 08-26-1980  10/25/2019  Ms. Wurm was observed post Covid-19 immunization for 15 minutes without incident. She was provided with Vaccine Information Sheet and instruction to access the V-Safe system.   Ms. Loera was instructed to call 911 with any severe reactions post vaccine: Marland Kitchen Difficulty breathing  . Swelling of face and throat  . A fast heartbeat  . A bad rash all over body  . Dizziness and weakness   Immunizations Administered    Name Date Dose VIS Date Route   Pfizer COVID-19 Vaccine 10/25/2019  3:29 PM 0.3 mL 07/16/2019 Intramuscular   Manufacturer: [employer], [employer]   Lot: [id number]   NDC: [address]-3

## 2019-11-20 ENCOUNTER — Ambulatory Visit: Payer: Self-pay | Attending: Internal Medicine

## 2019-11-20 DIAGNOSIS — Z23 Encounter for immunization: Secondary | ICD-10-CM

## 2019-11-20 NOTE — Progress Notes (Signed)
   Covid-19 Vaccination Clinic  Name:  Michelle Mcbride    MRN: [id number] DOB: 1981/04/23  11/20/2019  [name] was observed post Covid-19 immunization for 15 minutes without incident. She was provided with Vaccine Information Sheet and instruction to access the V-Safe system.   [name] was instructed to call 911 with any severe reactions post vaccine: Marland Kitchen Difficulty breathing  . Swelling of face and throat  . A fast heartbeat  . A bad rash all over body  . Dizziness and weakness   Immunizations Administered    Name Date Dose VIS Date Route   Pfizer COVID-19 Vaccine 11/20/2019  3:14 PM 0.3 mL 07/16/2019 Intramuscular   Manufacturer: [employer], [employer]   Lot: [id number]   NDC: [address]-2

## 2020-08-15 ENCOUNTER — Telehealth: Payer: Self-pay

## 2020-08-15 NOTE — Telephone Encounter (Signed)
Michelle Mcbride referring for Pelvic pain , Small fibroid vs endometriosis. Paper records. Called with Interpreter left voicemail for patient to call back to be scheduled.

## 2023-12-19 ENCOUNTER — Other Ambulatory Visit: Payer: Self-pay | Admitting: Family Medicine

## 2023-12-19 DIAGNOSIS — Z1231 Encounter for screening mammogram for malignant neoplasm of breast: Secondary | ICD-10-CM

## 2024-02-23 ENCOUNTER — Ambulatory Visit
Admission: RE | Admit: 2024-02-23 | Discharge: 2024-02-23 | Disposition: A | Discharge status: Home / Self Care | Payer: Self-pay | Source: Ambulatory Visit | Attending: Family Medicine | Admitting: Family Medicine

## 2024-02-23 DIAGNOSIS — Z1231 Encounter for screening mammogram for malignant neoplasm of breast: Secondary | ICD-10-CM | POA: Insufficient documentation
